# Patient Record
Sex: Male | Born: 2000 | Race: White | Hispanic: No | Marital: Single | Smoking: Never smoker
Health system: Southern US, Community
[De-identification: ages and names within clinical notes are randomized; demographics above are authoritative.]

## PROBLEM LIST (undated history)

## (undated) DIAGNOSIS — I499 Cardiac arrhythmia, unspecified: Secondary | ICD-10-CM

## (undated) HISTORY — PX: CARDIAC CATHETERIZATION: SHX172

---
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
## 2021-10-17 ENCOUNTER — Encounter: Payer: Self-pay | Admitting: Emergency Medicine

## 2021-10-17 ENCOUNTER — Other Ambulatory Visit: Payer: Self-pay

## 2021-10-17 DIAGNOSIS — F332 Major depressive disorder, recurrent severe without psychotic features: Secondary | ICD-10-CM | POA: Insufficient documentation

## 2021-10-17 DIAGNOSIS — Z20822 Contact with and (suspected) exposure to covid-19: Secondary | ICD-10-CM | POA: Insufficient documentation

## 2021-10-17 DIAGNOSIS — R45851 Suicidal ideations: Secondary | ICD-10-CM | POA: Insufficient documentation

## 2021-10-17 DIAGNOSIS — Y900 Blood alcohol level of less than 20 mg/100 ml: Secondary | ICD-10-CM | POA: Insufficient documentation

## 2021-10-17 LAB — ETHANOL: Alcohol, Ethyl (B): <10 mg/dL (ref <10)

## 2021-10-17 LAB — COMPREHENSIVE METABOLIC PANEL
ALT: 13 U/L (ref 0–44)
AST: 17 U/L (ref 15–41)
Albumin: 4.6 g/dL (ref 3.5–5.0)
Alkaline Phosphatase: 63 U/L (ref 38–126)
Anion gap: 5 (ref 5–15)
BUN: 17 mg/dL (ref 6–20)
CO2: 26 mmol/L (ref 22–32)
Calcium: 9.2 mg/dL (ref 8.9–10.3)
Chloride: 106 mmol/L (ref 98–111)
Creatinine, Ser: 0.88 mg/dL (ref 0.61–1.24)
GFR, Estimated: >60 mL/min (ref >60)
Glucose, Bld: 119 mg/dL — ABNORMAL HIGH (ref 70–99)
Potassium: 3.5 mmol/L (ref 3.5–5.1)
Sodium: 137 mmol/L (ref 135–145)
Total Bilirubin: 0.8 mg/dL (ref 0.3–1.2)
Total Protein: 7.6 g/dL (ref 6.5–8.1)

## 2021-10-17 LAB — CBC
HCT: 45.9 % (ref 39.0–52.0)
Hemoglobin: 15.9 g/dL (ref 13.0–17.0)
MCH: 30.2 pg (ref 26.0–34.0)
MCHC: 34.6 g/dL (ref 30.0–36.0)
MCV: 87.1 fL (ref 80.0–100.0)
Platelets: 177 10*3/uL (ref 150–400)
RBC: 5.27 MIL/uL (ref 4.22–5.81)
RDW: 12.3 % (ref 11.5–15.5)
WBC: 9 10*3/uL (ref 4.0–10.5)
nRBC: 0 % (ref 0.0–0.2)

## 2021-10-17 LAB — ACETAMINOPHEN LEVEL: Acetaminophen (Tylenol), Serum: <10 ug/mL — ABNORMAL LOW (ref 10–30)

## 2021-10-17 LAB — SALICYLATE LEVEL: Salicylate Lvl: <7 mg/dL — ABNORMAL LOW (ref 7.0–30.0)

## 2021-10-17 NOTE — ED Triage Notes (Signed)
Pt to ED from home brought in by police voluntarily for SI.  States just feeling down, like no one cares.  States sister called police.  Would cut wrists with knife.  Has cut his back before in the past.  Denies psych diagnosis or medications.  Denies drugs or alcohol, denies HI or A/V hallucinations.  Pt calm and cooperative in triage, chest rise even and unlabored.  Pt dressed into hospital appropriate scrubs by this RN and Georgiann Hahn, EDT.  Belongings placed into 1 bag.  Contents include: 2 braided bracelets, 1 silicone bracelet, 2 cell phones, wallet (no cash), 1 black t shirt, 1 pair green shoes, 1 pair black socks, 1 pair green long pants, 1 black belt, 1 pair blue boxers.

## 2021-10-18 ENCOUNTER — Inpatient Hospital Stay
Admission: RE | Admit: 2021-10-18 | Discharge: 2021-10-20 | DRG: 885 | Disposition: A | Discharge status: Home / Self Care | Payer: Medicaid Other | Source: Intra-hospital | Attending: Psychiatry | Admitting: Psychiatry

## 2021-10-18 ENCOUNTER — Other Ambulatory Visit: Payer: Self-pay

## 2021-10-18 ENCOUNTER — Encounter: Payer: Self-pay | Admitting: Psychiatry

## 2021-10-18 ENCOUNTER — Emergency Department (EMERGENCY_DEPARTMENT_HOSPITAL)
Admission: EM | Admit: 2021-10-18 | Discharge: 2021-10-18 | Disposition: A | Discharge status: Other Acute Inpatient Hospital | Payer: Medicaid Other | Source: Home / Self Care | Attending: Emergency Medicine | Admitting: Emergency Medicine

## 2021-10-18 DIAGNOSIS — F332 Major depressive disorder, recurrent severe without psychotic features: Secondary | ICD-10-CM | POA: Diagnosis present

## 2021-10-18 DIAGNOSIS — F4310 Post-traumatic stress disorder, unspecified: Secondary | ICD-10-CM | POA: Diagnosis present

## 2021-10-18 DIAGNOSIS — I350 Nonrheumatic aortic (valve) stenosis: Secondary | ICD-10-CM | POA: Diagnosis present

## 2021-10-18 DIAGNOSIS — R45851 Suicidal ideations: Secondary | ICD-10-CM | POA: Diagnosis present

## 2021-10-18 DIAGNOSIS — Z818 Family history of other mental and behavioral disorders: Secondary | ICD-10-CM | POA: Diagnosis not present

## 2021-10-18 DIAGNOSIS — R9431 Abnormal electrocardiogram [ECG] [EKG]: Secondary | ICD-10-CM | POA: Diagnosis not present

## 2021-10-18 DIAGNOSIS — Z9151 Personal history of suicidal behavior: Secondary | ICD-10-CM | POA: Diagnosis not present

## 2021-10-18 DIAGNOSIS — Z20822 Contact with and (suspected) exposure to covid-19: Secondary | ICD-10-CM | POA: Diagnosis present

## 2021-10-18 DIAGNOSIS — G4700 Insomnia, unspecified: Secondary | ICD-10-CM | POA: Diagnosis present

## 2021-10-18 DIAGNOSIS — Z91410 Personal history of adult physical and sexual abuse: Secondary | ICD-10-CM

## 2021-10-18 DIAGNOSIS — Z62811 Personal history of psychological abuse in childhood: Secondary | ICD-10-CM | POA: Diagnosis present

## 2021-10-18 DIAGNOSIS — F32A Depression, unspecified: Secondary | ICD-10-CM

## 2021-10-18 DIAGNOSIS — Z5181 Encounter for therapeutic drug level monitoring: Secondary | ICD-10-CM | POA: Diagnosis not present

## 2021-10-18 LAB — RESP PANEL BY RT-PCR (FLU A&B, COVID) ARPGX2
Influenza A by PCR: NEGATIVE
Influenza B by PCR: NEGATIVE
SARS Coronavirus 2 by RT PCR: NEGATIVE

## 2021-10-18 LAB — URINE DRUG SCREEN, QUALITATIVE (ARMC ONLY)
Amphetamines, Ur Screen: NOT DETECTED
Barbiturates, Ur Screen: NOT DETECTED
Benzodiazepine, Ur Scrn: NOT DETECTED
Cannabinoid 50 Ng, Ur ~~LOC~~: POSITIVE — AB
Cocaine Metabolite,Ur ~~LOC~~: NOT DETECTED
MDMA (Ecstasy)Ur Screen: NOT DETECTED
Methadone Scn, Ur: NOT DETECTED
Opiate, Ur Screen: NOT DETECTED
Phencyclidine (PCP) Ur S: NOT DETECTED
Tricyclic, Ur Screen: NOT DETECTED

## 2021-10-18 MED ORDER — ESCITALOPRAM OXALATE 10 MG PO TABS
5.0000 mg | ORAL_TABLET | Freq: Every day | ORAL | Status: DC
  Administered 2021-10-18: 11:00:00 5 mg via ORAL
  Filled 2021-10-18: qty 1

## 2021-10-18 MED ORDER — ESCITALOPRAM OXALATE 10 MG PO TABS: 5.0000 mg | ORAL_TABLET | Freq: Every day | ORAL | Status: DC

## 2021-10-18 MED ORDER — MAGNESIUM HYDROXIDE 400 MG/5ML PO SUSP: 30.0000 mL | Freq: Every day | ORAL | Status: DC | PRN

## 2021-10-18 MED ORDER — ALUM & MAG HYDROXIDE-SIMETH 200-200-20 MG/5ML PO SUSP: 30.0000 mL | ORAL | Status: DC | PRN

## 2021-10-18 MED ORDER — TRAZODONE HCL 50 MG PO TABS
50.0000 mg | ORAL_TABLET | Freq: Every day | ORAL | Status: DC
  Administered 2021-10-18: 21:00:00 50 mg via ORAL
  Filled 2021-10-18 (×2): qty 1

## 2021-10-18 MED ORDER — ACETAMINOPHEN 325 MG PO TABS: 650.0000 mg | ORAL_TABLET | Freq: Four times a day (QID) | ORAL | Status: DC | PRN

## 2021-10-18 MED ORDER — ESCITALOPRAM OXALATE 10 MG PO TABS
10.0000 mg | ORAL_TABLET | Freq: Every day | ORAL | Status: DC
  Administered 2021-10-19 – 2021-10-20 (×2): 10 mg via ORAL
  Filled 2021-10-18 (×2): qty 1

## 2021-10-18 MED ORDER — TRAZODONE HCL 50 MG PO TABS: 50.0000 mg | ORAL_TABLET | Freq: Every day | ORAL | Status: DC

## 2021-10-18 NOTE — Plan of Care (Signed)
D: Patient is newly admitted to [facility]. Patient's skin assessment completed with Kaelyn RN, skin is intact, no contraband found. Patient reports passive SI without a plan denied HI/AVH. Patient reports denies ETOH / Smoking. Patient reports current stressors of having nothing or no one to support himself.    A: Patient oriented to unit/room/call light. Patient was offered support and encouragement. Patient was encourage to attend groups, participate in unit activities and continue with plan of care. Q x 15 minute observation checks were completed for safety.   R: Patient has no complaints at this time. Patient is receptive to treatment and safety maintained on unit.
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
## 2021-10-18 NOTE — Tx Team (Signed)
Initial Treatment Plan 10/18/2021 1:46 PM Travis Colon [id number]    PATIENT STRESSORS: Financial difficulties   Marital or family conflict     PATIENT STRENGTHS: Capable of independent living  General fund of knowledge    PATIENT IDENTIFIED PROBLEMS: Ineffective coping skills  Suicidal Ideations                   DISCHARGE CRITERIA:  Ability to meet basic life and health needs Adequate post-discharge living arrangements  PRELIMINARY DISCHARGE PLAN: Return to previous living arrangement Return to previous work or school arrangements  PATIENT/FAMILY INVOLVEMENT: This treatment plan has been presented to and reviewed with the patient, Travis Colon,.  The patient and family have been given the opportunity to ask questions and make suggestions.  Berkley Harvey, RN 10/18/2021, 1:46 PM

## 2021-10-18 NOTE — ED Notes (Signed)
Report received from [place], [place]. Patient moved to [facility] 5 and oriented to unit. Awaiting psych eval. No other questions or needs at this time.

## 2021-10-18 NOTE — ED Notes (Signed)
VOL/Psych consult ordered/Pending

## 2021-10-18 NOTE — ED Provider Notes (Signed)
[facility] Emergency Department Provider Note  ____________________________________________   Event Date/Time   First MD Initiated Contact with Patient 10/18/21 [phone]     (approximate)  I have reviewed the triage vital signs and the nursing notes.   HISTORY  Chief Complaint Suicidal    HPI Travis Colon is a 20 y.o. male who reports suffering from depression for a long time.  He presents voluntarily tonight for evaluation of worsening depression with suicidal ideation.  He said that he just feels like nothing matters and that no one cares.  He spoke with his sister who called the police.  He claims to have been thinking about cutting his wrists.  He does not see a therapist and does not take any medications.  He denies drug use.  He has no medical complaints or concerns.  He cannot quantify how bad his symptoms are but reportedly they were severe tonight.  Nothing in particular makes them better or worse.     History reviewed. No pertinent past medical history.  There are no problems to display for this patient.   History reviewed. No pertinent surgical history.  Prior to Admission medications   Not on File    Allergies Patient has no known allergies.  History reviewed. No pertinent family history.  Social History Social History   Tobacco Use   Smoking status: Never   Smokeless tobacco: Never  Substance Use Topics   Alcohol use: Never   Drug use: Never    Review of Systems Constitutional: No fever/chills Eyes: No visual changes. ENT: No sore throat. Cardiovascular: Denies chest pain. Respiratory: Denies shortness of breath. Gastrointestinal: No abdominal pain.  No nausea, no vomiting.  No diarrhea.  No constipation. Genitourinary: Negative for dysuria. Musculoskeletal: Negative for neck pain.  Negative for back pain. Integumentary: Negative for rash. Neurological: Negative for headaches, focal weakness or numbness. Psychiatric:  Depression with suicidal thoughts, considering cutting his wrists.  ____________________________________________   PHYSICAL EXAM:  VITAL SIGNS: ED Triage Vitals  Enc Vitals Group     BP 10/17/21 2253 (!) 161/86     Pulse Rate 10/17/21 2253 85     Resp 10/17/21 2253 16     Temp 10/17/21 2253 98.4 F (36.9 C)     Temp Source 10/17/21 2253 Oral     SpO2 10/17/21 2253 98 %     Weight 10/17/21 2254 90.7 kg (200 lb)     Height 10/17/21 2254 2.007 m (6\' 7" )     Head Circumference --      Peak Flow --      Pain Score 10/17/21 2254 0     Pain Loc --      Pain Edu? --      Excl. in GC? --     Constitutional: Alert and oriented.  Eyes: Conjunctivae are normal.  Head: Atraumatic. Nose: No congestion/rhinnorhea. Mouth/Throat: Patient is wearing a mask. Neck: No stridor.  No meningeal signs.   Cardiovascular: Normal rate, regular rhythm. Good peripheral circulation. Respiratory: Normal respiratory effort.  No retractions. Gastrointestinal: Soft and nontender. No distention.  Musculoskeletal: No lower extremity tenderness nor edema. No gross deformities of extremities. Neurologic:  Normal speech and language. No gross focal neurologic deficits are appreciated.  Skin:  Skin is warm, dry and intact. Psychiatric: Mood and affect are flat.  Admits to SI earlier, denies SI now, admits to depression.  Denies HI.  ____________________________________________   LABS (all labs ordered are listed, but only abnormal results are  displayed)  Labs Reviewed  COMPREHENSIVE METABOLIC PANEL - Abnormal; Notable for the following components:      Result Value   Glucose, Bld 119 (*)    All other components within normal limits  SALICYLATE LEVEL - Abnormal; Notable for the following components:   Salicylate Lvl <7.0 (*)    All other components within normal limits  ACETAMINOPHEN LEVEL - Abnormal; Notable for the following components:   Acetaminophen (Tylenol), Serum <10 (*)    All other components  within normal limits  RESP PANEL BY RT-PCR (FLU A&B, COVID) ARPGX2  ETHANOL  CBC  URINE DRUG SCREEN, QUALITATIVE ([facility] ONLY)   ____________________________________________   INITIAL IMPRESSION / MDM / ASSESSMENT AND PLAN / ED COURSE  As part of my medical decision making, I reviewed the following data within the electronic MEDICAL RECORD NUMBER Nursing notes reviewed and incorporated, Labs reviewed , Old chart reviewed, and Notes from prior ED visits   Differential diagnosis includes, but is not limited to, depression, adjustment disorder, mood disorder.  Vital signs stable.  Patient calm and cooperative and shows good insight into his illness.  He showed good judgment by coming to the emergency department voluntarily even though it was after his sister called law enforcement.  Respiratory viral panel, CBC, ethanol level, acetaminophen level, salicylate level, and CMP are all within normal limits.  The patient is here voluntarily at this time and wants help.  It is unclear to me at this point whether he meets involuntary commitment criteria.  I do not believe he represents an immediate danger to himself and I think he would benefit from evaluation by psychiatry, but unfortunately we do not have psychiatry on staff tonight.  At this point the patient is comfortable waiting for evaluation.  I will have to reassess if he decides to leave but I will hold off putting him under involuntary commitment at this time.  The patient has been placed in psychiatric observation due to the need to provide a safe environment for the patient while obtaining psychiatric consultation and evaluation, as well as ongoing medical and medication management to treat the patient's condition.  The patient has not been placed under full IVC at this time.          ____________________________________________  FINAL CLINICAL IMPRESSION(S) / ED DIAGNOSES  Final diagnoses:  Depression, unspecified depression type   Suicidal ideation     MEDICATIONS GIVEN DURING THIS VISIT:  Medications - No data to display   ED Discharge Orders     None        Note:  This document was prepared using Dragon voice recognition software and may include unintentional dictation errors.   Loleta Rose, MD 10/18/21 [phone]
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
## 2021-10-18 NOTE — Plan of Care (Signed)
New admit   Problem: Education: Goal: Knowledge of  General Education information/materials will improve Outcome: Not Progressing Goal: Emotional status will improve Outcome: Not Progressing Goal: Mental status will improve Outcome: Not Progressing Goal: Verbalization of understanding the information provided will improve Outcome: Not Progressing

## 2021-10-18 NOTE — ED Notes (Signed)
Patient reports having thoughts of wanting to harm himself tonight by cutting his wrist but did not.  Also reports that he had these thoughts a few weeks ago and did not act on them then.  Patient does not give triggering reason(s) for these thoughts.  Patient does not maintain eye contact.

## 2021-10-18 NOTE — BHH Suicide Risk Assessment (Signed)
[facility] Admission Suicide Risk Assessment   Nursing information obtained from:  Patient Demographic factors:  Male Current Mental Status:  Suicidal ideation indicated by patient Loss Factors:  Decrease in vocational status, Financial problems / change in socioeconomic status Historical Factors:  NA Risk Reduction Factors:  NA  Principal Problem: Major depressive disorder, recurrent severe without psychotic features ([facility]) Diagnosis:  Principal Problem:   Major depressive disorder, recurrent severe without psychotic features ([facility])  Subjective Data:   Patient is 20 year old Caucasian male with no known mental health history, who presented to [facility] ED voluntarily due to complaints of worsening depression with suicidal thoughts and plan to cut his wrists.   Continued Clinical Symptoms:  Alcohol Use Disorder Identification Test Final Score (AUDIT): 0 The "Alcohol Use Disorders Identification Test", Guidelines for Use in Primary Care, Second Edition.  World Science writer [facility]). Score between 0-7:  no or low risk or alcohol related problems. Score between 8-15:  moderate risk of alcohol related problems. Score between 16-19:  high risk of alcohol related problems. Score 20 or above:  warrants further diagnostic evaluation for alcohol dependence and treatment.   CLINICAL FACTORS:   Depression:   Anhedonia Hopelessness Impulsivity Insomnia Severe   COGNITIVE FEATURES THAT CONTRIBUTE TO RISK:  None    SUICIDE RISK:   Moderate:  Frequent suicidal ideation with limited intensity, and duration, some specificity in terms of plans, no associated intent, good self-control, limited dysphoria/symptomatology, some risk factors present, and identifiable protective factors, including available and accessible social support.  PLAN OF CARE:  -inpatient psychiatric admission will be continued. -patient will be integrated in the milieu.   -patient will be encouraged to attend groups.      -Medications: We will continue started in ER Lexapro and will increase the dose to 10 mg po daily for depression, anxiety, PTSD. Will continue Trazodone 50 mg PO QHS for insomnia.   -We will attempt to collect collateral information.   -Disposition will be determined after the patient is stabilized.    I certify that inpatient services furnished can reasonably be expected to improve the patient's condition.   Thalia Party, MD 10/18/2021, 3:08 PM

## 2021-10-19 NOTE — Progress Notes (Signed)
D: Pt alert and oriented. Pt rates depression 2/10, hopelessness 0/10, and anxiety 0/10. Pt goal: "I would like to focus on myself instead of worrying about someone else." Pt reports energy level as normal and concentration as being good. Pt reports sleep last night as being good. Pt did not receive medications for sleep. Pt denies experiencing any pain at this time. Pt denies experiencing any SI/HI, or AVH at this time.   Pt is observed as being very quiet and reserved. Pt did attend group today and can be observed in the milieu interacting with others on the unit.  A: Scheduled medications administered to pt, per MD orders. Support and encouragement provided. Frequent verbal contact made. Routine safety checks conducted q15 minutes.   R: No adverse drug reactions noted. Pt verbally contracts for safety at this time. Pt complaint with medications and treatment plan. Pt interacts well with others on the unit. Pt remains safe at this time. Will continue to monitor.

## 2021-10-19 NOTE — Group Note (Signed)
[facility] LCSW Group Therapy Note   Group Date: 10/19/2021 Start Time: 1300 End Time: 1400  Type of Therapy/Topic:  Group Therapy:  Feelings about Diagnosis  Participation Level:  Active   Description of Group:    This group will allow patients to explore their thoughts and feelings about diagnoses they have received. Patients will be guided to explore their level of understanding and acceptance of these diagnoses. Facilitator will encourage patients to process their thoughts and feelings about the reactions of others to their diagnosis, and will guide patients in identifying ways to discuss their diagnosis with significant others in their lives. This group will be process-oriented, with patients participating in exploration of their own experiences as well as giving and receiving support and challenge from other group members.   Therapeutic Goals: 1. Patient will demonstrate understanding of diagnosis as evidence by identifying two or more symptoms of the disorder:  2. Patient will be able to express two feelings regarding the diagnosis 3. Patient will demonstrate ability to communicate their needs through discussion and/or role plays  Summary of Patient Progress: Patient was present in group.  Patient shared that for him "diagnosis" means "what makes you special".  He was engaged in group and attentive.  Patient shared that he does not feel like that he has a support system.  He reports that people are often more kind to physical diagnosis then mental health ones.    Therapeutic Modalities:   Cognitive Behavioral Therapy Brief Therapy Feelings Identification    Harden Mo, LCSW

## 2021-10-19 NOTE — Progress Notes (Signed)
[facility] MD Progress Note  10/19/2021 12:49 PM Travis Colon  MRN:  [id number]  Subjective:  20 yo male admitted with suicidal ideations related to depression.  His depression is low as it "is nice to be around people."  He likes to play video games, no money for games.  Sleep was "good" last night with medication and appetite is "good".  No side effects from his medications.  Principal Problem: Major depressive disorder, recurrent severe without psychotic features (HCC) Diagnosis: Principal Problem:   Major depressive disorder, recurrent severe without psychotic features (HCC)  Total Time spent with patient: 1 hour  Past Psychiatric History: depression and anxiety  Past Medical History: History reviewed. No pertinent past medical history. History reviewed. No pertinent surgical history. Family History: History reviewed. No pertinent family history. Family Psychiatric  History: mother with substance use disorder Social History:  Social History   Substance and Sexual Activity  Alcohol Use Never     Social History   Substance and Sexual Activity  Drug Use Never    Social History   Socioeconomic History   Marital status: Single    Spouse name: Not on file   Number of children: Not on file   Years of education: Not on file   Highest education level: Not on file  Occupational History   Not on file  Tobacco Use   Smoking status: Never   Smokeless tobacco: Never  Substance and Sexual Activity   Alcohol use: Never   Drug use: Never   Sexual activity: Not on file  Other Topics Concern   Not on file  Social History Narrative   Not on file   Social Determinants of Health   Financial Resource Strain: Not on file  Food Insecurity: Not on file  Transportation Needs: Not on file  Physical Activity: Not on file  Stress: Not on file  Social Connections: Not on file   Additional Social History: lives in an apartment with a roommate and worked at [employer]    Sleep:  Good  Appetite:  Good  Current Medications: Current Facility-Administered Medications  Medication Dose Route Frequency Provider Last Rate Last Admin   acetaminophen (TYLENOL) tablet 650 mg  650 mg Oral Q6H PRN Charm Rings, NP       alum & mag hydroxide-simeth (MAALOX/MYLANTA) 200-200-20 MG/5ML suspension 30 mL  30 mL Oral Q4H PRN Charm Rings, NP       escitalopram (LEXAPRO) tablet 10 mg  10 mg Oral Daily Thalia Party, MD   10 mg at 10/19/21 4431   magnesium hydroxide (MILK OF MAGNESIA) suspension 30 mL  30 mL Oral Daily PRN Charm Rings, NP       traZODone (DESYREL) tablet 50 mg  50 mg Oral QHS Charm Rings, NP   50 mg at 10/18/21 2117    Lab Results:  Results for orders placed or performed during the hospital encounter of 10/18/21 (from the past 48 hour(s))  Comprehensive metabolic panel     Status: Abnormal   Collection Time: 10/17/21 11:01 PM  Result Value Ref Range   Sodium 137 135 - 145 mmol/L   Potassium 3.5 3.5 - 5.1 mmol/L   Chloride 106 98 - 111 mmol/L   CO2 26 22 - 32 mmol/L   Glucose, Bld 119 (H) 70 - 99 mg/dL    Comment: Glucose reference range applies only to samples taken after fasting for at least 8 hours.   BUN 17 6 - 20 mg/dL  Creatinine, Ser 0.88 0.61 - 1.24 mg/dL   Calcium 9.2 8.9 - 35.3 mg/dL   Total Protein 7.6 6.5 - 8.1 g/dL   Albumin 4.6 3.5 - 5.0 g/dL   AST 17 15 - 41 U/L   ALT 13 0 - 44 U/L   Alkaline Phosphatase 63 38 - 126 U/L   Total Bilirubin 0.8 0.3 - 1.2 mg/dL   GFR, Estimated >61 >44 mL/min    Comment: (NOTE) Calculated using the CKD-EPI Creatinine Equation (2021)    Anion gap 5 5 - 15    Comment: Performed at [facility], [address] [place], [place] [address]  Ethanol     Status: None   Collection Time: 10/17/21 11:01 PM  Result Value Ref Range   Alcohol, Ethyl (B) <10 <10 mg/dL    Comment: (NOTE) Lowest detectable limit for serum alcohol is 10 mg/dL.  For medical purposes only. Performed at [facility], [address] [place], [place] [address]   Salicylate level     Status: Abnormal   Collection Time: 10/17/21 11:01 PM  Result Value Ref Range   Salicylate Lvl <7.0 (L) 7.0 - 30.0 mg/dL    Comment: Performed at [facility], [address] [place], [place] [address]  Acetaminophen level     Status: Abnormal   Collection Time: 10/17/21 11:01 PM  Result Value Ref Range   Acetaminophen (Tylenol), Serum <10 (L) 10 - 30 ug/mL    Comment: (NOTE) Therapeutic concentrations vary significantly. A range of 10-30 ug/mL  may be an effective concentration for many patients. However, some  are best treated at concentrations outside of this range. Acetaminophen concentrations >150 ug/mL at 4 hours after ingestion  and >50 ug/mL at 12 hours after ingestion are often associated with  toxic reactions.  Performed at [facility], [address] [place], [place] [address]   cbc     Status: None   Collection Time: 10/17/21 11:01 PM  Result Value Ref Range   WBC 9.0 4.0 - 10.5 K/uL   RBC 5.27 4.22 - 5.81 MIL/uL   Hemoglobin 15.9 13.0 - 17.0 g/dL   HCT 24.5 80.9 - 98.3 %   MCV 87.1 80.0 - 100.0 fL   MCH 30.2 26.0 - 34.0 pg   MCHC 34.6 30.0 - 36.0 g/dL   RDW 38.2 50.5 - 39.7 %   Platelets 177 150 - 400 K/uL   nRBC 0.0 0.0 - 0.2 %    Comment: Performed at [facility], [address], [place], [place] [address]  Resp Panel by RT-PCR (Flu A&B, Covid) Nasopharyngeal Swab     Status: None   Collection Time: 10/17/21 11:02 PM   Specimen: Nasopharyngeal Swab; Nasopharyngeal(NP) swabs in vial transport medium  Result Value Ref Range   SARS Coronavirus 2 by RT PCR NEGATIVE NEGATIVE    Comment: (NOTE) SARS-CoV-2 target nucleic acids are NOT DETECTED.  The SARS-CoV-2 RNA is generally detectable in upper respiratory specimens during the acute phase of infection. The lowest concentration of SARS-CoV-2 viral copies this assay can detect is 138 copies/mL. A  negative result does not preclude SARS-Cov-2 infection and should not be used as the sole basis for treatment or other patient management decisions. A negative result may occur with  improper specimen collection/handling, submission of specimen other than nasopharyngeal swab, presence of viral mutation(s) within the areas targeted by this assay, and inadequate number of viral copies(<138 copies/mL). A negative result must be combined with clinical  observations, patient history, and epidemiological information. The expected result is Negative.  Fact Sheet for Patients:  BloggerCourse.com  Fact Sheet for Healthcare Providers:  SeriousBroker.it  This test is no t yet approved or cleared by the [place] FDA and  has been authorized for detection and/or diagnosis of SARS-CoV-2 by FDA under an Emergency Use Authorization (EUA). This EUA will remain  in effect (meaning this test can be used) for the duration of the COVID-19 declaration under Section 564(b)(1) of the Act, 21 U.S.C.section 360bbb-3(b)(1), unless the authorization is terminated  or revoked sooner.       Influenza A by PCR NEGATIVE NEGATIVE   Influenza B by PCR NEGATIVE NEGATIVE    Comment: (NOTE) The Xpert Xpress SARS-CoV-2/FLU/RSV plus assay is intended as an aid in the diagnosis of influenza from Nasopharyngeal swab specimens and should not be used as a sole basis for treatment. Nasal washings and aspirates are unacceptable for Xpert Xpress SARS-CoV-2/FLU/RSV testing.  Fact Sheet for Patients: BloggerCourse.com  Fact Sheet for Healthcare Providers: SeriousBroker.it  This test is not yet approved or cleared by the [place] FDA and has been authorized for detection and/or diagnosis of SARS-CoV-2 by FDA under an Emergency Use Authorization (EUA). This EUA will remain in effect (meaning this test can be used)  for the duration of the COVID-19 declaration under Section 564(b)(1) of the Act, 21 U.S.C. section 360bbb-3(b)(1), unless the authorization is terminated or revoked.  Performed at [facility], [address] [place], [place] [address]   Urine Drug Screen, Qualitative     Status: Abnormal   Collection Time: 10/18/21 11:53 AM  Result Value Ref Range   Tricyclic, Ur Screen NONE DETECTED NONE DETECTED   Amphetamines, Ur Screen NONE DETECTED NONE DETECTED   MDMA (Ecstasy)Ur Screen NONE DETECTED NONE DETECTED   Cocaine Metabolite,Ur [place] NONE DETECTED NONE DETECTED   Opiate, Ur Screen NONE DETECTED NONE DETECTED   Phencyclidine (PCP) Ur S NONE DETECTED NONE DETECTED   Cannabinoid 50 Ng, Ur [place] POSITIVE (A) NONE DETECTED   Barbiturates, Ur Screen NONE DETECTED NONE DETECTED   Benzodiazepine, Ur Scrn NONE DETECTED NONE DETECTED   Methadone Scn, Ur NONE DETECTED NONE DETECTED    Comment: (NOTE) Tricyclics + metabolites, urine    Cutoff 1000 ng/mL Amphetamines + metabolites, urine  Cutoff 1000 ng/mL MDMA (Ecstasy), urine              Cutoff 500 ng/mL Cocaine Metabolite, urine          Cutoff 300 ng/mL Opiate + metabolites, urine        Cutoff 300 ng/mL Phencyclidine (PCP), urine         Cutoff 25 ng/mL Cannabinoid, urine                 Cutoff 50 ng/mL Barbiturates + metabolites, urine  Cutoff 200 ng/mL Benzodiazepine, urine              Cutoff 200 ng/mL Methadone, urine                   Cutoff 300 ng/mL  The urine drug screen provides only a preliminary, unconfirmed analytical test result and should not be used for non-medical purposes. Clinical consideration and professional judgment should be applied to any positive drug screen result due to possible interfering substances. A more specific alternate chemical method must be used in order to obtain a confirmed analytical result. Gas chromatography / mass spectrometry (GC/MS) is the preferred confirm atory  method. Performed  at [facility], [address] [place], [place] [address]     Blood Alcohol level:  Lab Results  Component Value Date   [facility] <10 10/17/2021    Metabolic Disorder Labs: No results found for: HGBA1C, MPG No results found for: PROLACTIN No results found for: CHOL, TRIG, HDL, CHOLHDL, VLDL, LDLCALC  Musculoskeletal: Strength & Muscle Tone: within normal limits Gait & Station: normal Patient leans: N/A  Psychiatric Specialty Exam: Physical Exam Vitals and nursing note reviewed.  Constitutional:      Appearance: Normal appearance.  HENT:     Head: Normocephalic.     Nose: Nose normal.  Pulmonary:     Effort: Pulmonary effort is normal.  Musculoskeletal:        General: Normal range of motion.     Cervical back: Normal range of motion.  Neurological:     General: No focal deficit present.     Mental Status: He is alert and oriented to person, place, and time.  Psychiatric:        Attention and Perception: Attention and perception normal.        Mood and Affect: Mood is anxious and depressed.        Speech: Speech normal.        Behavior: Behavior normal. Behavior is cooperative.        Thought Content: Thought content normal.        Cognition and Memory: Cognition and memory normal.        Judgment: Judgment normal.    Review of Systems  Constitutional:  Positive for malaise/fatigue.  Psychiatric/Behavioral:  Positive for depression. The patient is nervous/anxious.   All other systems reviewed and are negative.  Blood pressure 133/73, pulse (!) 103, temperature 98.3 F (36.8 C), temperature source Oral, resp. rate 18, height  (2.007 m), weight 83 kg, SpO2 98 %.Body mass index is 20.62 kg/m.  General Appearance: Casual  Eye Contact:  Fair  Speech:  Normal Rate  Volume:  Normal  Mood:  Anxious and Depressed  Affect:  Congruent  Thought Process:  Coherent and Descriptions of Associations: Intact  Orientation:  Full (Time, Place, and Person)  Thought  Content:  Rumination  Suicidal Thoughts:  No  Homicidal Thoughts:  No  Memory:  Immediate;   Good Recent;   Good Remote;   Good  Judgement:  Fair  Insight:  Fair  Psychomotor Activity:  Normal  Concentration:  Concentration: Good and Attention Span: Good  Recall:  Good  Fund of Knowledge:  Good  Language:  Good  Akathisia:  No  Handed:  Right  AIMS (if indicated):     Assets:  Housing Leisure Time Physical Health Resilience  ADL's:  Intact  Cognition:  WNL  Sleep:  Number of Hours: 8.15      Physical Exam: Physical Exam Vitals and nursing note reviewed.  Constitutional:      Appearance: Normal appearance.  HENT:     Head: Normocephalic.     Nose: Nose normal.  Pulmonary:     Effort: Pulmonary effort is normal.  Musculoskeletal:        General: Normal range of motion.     Cervical back: Normal range of motion.  Neurological:     General: No focal deficit present.     Mental Status: He is alert and oriented to person, place, and time.  Psychiatric:        Attention and Perception: Attention and perception normal.  Mood and Affect: Mood is anxious and depressed.        Speech: Speech normal.        Behavior: Behavior normal. Behavior is cooperative.        Thought Content: Thought content normal.        Cognition and Memory: Cognition and memory normal.        Judgment: Judgment normal.   Review of Systems  Constitutional:  Positive for malaise/fatigue.  Psychiatric/Behavioral:  Positive for depression. The patient is nervous/anxious.   All other systems reviewed and are negative. Blood pressure 133/73, pulse (!) 103, temperature 98.3 F (36.8 C), temperature source Oral, resp. rate 18, height  (2.007 m), weight 83 kg, SpO2 98 %. Body mass index is 20.62 kg/m.   Treatment Plan Summary: Daily contact with patient to assess and evaluate symptoms and progress in treatment, Medication management, and Plan : Major depressive disorder, recurrent,  moderate: Continued Lexapro 10 mg daily  Insomnia: Continue Trazodone 50 mg daily at bedtime PRN  Nanine Means, NP 10/19/2021, 12:49 PM

## 2021-10-19 NOTE — [facility] Suicide Risk Assessment (Signed)
[facility] INPATIENT:  Family/Significant Other Suicide Prevention Education  Suicide Prevention Education:  Patient Refusal for Family/Significant Other Suicide Prevention Education: The patient Travis Colon has refused to provide written consent for family/significant other to be provided Family/Significant Other Suicide Prevention Education during admission and/or prior to discharge.  Physician notified. SPE completed with pt, as pt refused to consent to family contact. SPI pamphlet provided to pt and pt was encouraged to share information with support network, ask questions, and talk about any concerns relating to SPE. Pt denies access to guns/firearms and verbalized understanding of information provided. Mobile Crisis information also provided to pt.    Harden Mo 10/19/2021, 11:43 AM

## 2021-10-19 NOTE — Progress Notes (Signed)
Recreation Therapy Notes   Date: 10/19/2021  Time: 10:20 am   Location: Craft room    Behavioral response: Appropriate  Intervention Topic: Goals   Discussion/Intervention:  Group content on today was focused on goals. Patients described what goals are and how they define goals. Individuals expressed how they go about setting goals and reaching them. The group identified how important goals are and if they make short term goals to reach long term goals. Patients described how many goals they work on at a time and what affects them not reaching their goal. Individuals described how much time they put into planning and obtaining their goals. The group participated in the intervention My Goal Board and made personal goal boards to help them achieve their goal. Clinical Observations/Feedback: Patient came to group late due to unknown reasons. Individual engaged in the intervention and was social with staff and peers.  Travis Colon LRT/CTRS           [name] 10/19/2021 12:26 PM
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
## 2021-10-19 NOTE — Plan of Care (Signed)
°  Problem: Education: Goal: Knowledge of [place] [facility] Education information/materials will improve Outcome: Progressing Goal: Emotional status will improve Outcome: Progressing Goal: Mental status will improve Outcome: Progressing Goal: Verbalization of understanding the information provided will improve Outcome: Progressing   Problem: Health Behavior/Discharge Planning: Goal: Identification of resources available to assist in meeting health care needs will improve Outcome: Progressing Goal: Compliance with treatment plan for underlying cause of condition will improve Outcome: Progressing   Problem: Safety: Goal: Periods of time without injury will increase Outcome: Progressing

## 2021-10-20 DIAGNOSIS — F332 Major depressive disorder, recurrent severe without psychotic features: Principal | ICD-10-CM

## 2021-10-20 MED ORDER — TRAZODONE HCL 50 MG PO TABS
50.0000 mg | ORAL_TABLET | Freq: Every evening | ORAL | 2 refills | Status: AC | PRN
Start: 2021-10-20 — End: ?

## 2021-10-20 MED ORDER — ESCITALOPRAM OXALATE 10 MG PO TABS: 10.0000 mg | ORAL_TABLET | Freq: Every day | ORAL | 2 refills | Status: AC
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
## 2021-10-20 NOTE — Progress Notes (Signed)
D: Pt alert and oriented. Pt denies experiencing any pain, SI/HI, or AVH at this time. Pt reports he will be able to keep himself safe when he returns home.   A: Pt received discharge and medication education/information. Pt belongings were returned and signed for at this time.   R: Pt verbalized understanding of discharge and medication education/information.  Pt escorted by staff to medical mall front lobby where pt was picked up by his sister.

## 2021-10-20 NOTE — Progress Notes (Signed)
°  [facility] Adult Case Management Discharge Plan :  Will you be returning to the same living situation after discharge:  Yes,  pt plans to return home. At discharge, do you have transportation home?: Yes,  CSW to arrange transportation. Do you have the ability to pay for your medications: Yes,  [facility] [employer] [employer].  Release of information consent forms completed and in the chart;  Patient's signature needed at discharge.  Patient to Follow up at:  Follow-up Information     [facility] Follow up.   Why: Your appointment is scheduled for Wednesday, 10/27/21 at 9:30AM. Thanks! Contact information: [address] [place] [address] [phone]                 Next level of care provider has access to [facility] Link:no  Safety Planning and Suicide Prevention discussed: Yes,  SPE completed with pt.     Has patient been referred to the Quitline?: N/A patient is not a smoker  Patient has been referred for addiction treatment: N/A  Glenis Smoker, LCSW 10/20/2021, 9:43 AM

## 2021-10-20 NOTE — BHH Suicide Risk Assessment (Cosign Needed)
[facility] Discharge Suicide Risk Assessment   Principal Problem: Major depressive disorder, recurrent severe without psychotic features [facility]) Discharge Diagnoses: Principal Problem:   Major depressive disorder, recurrent severe without psychotic features (HCC)   Total Time spent with patient: 1 hour  Musculoskeletal: Strength & Muscle Tone: within normal limits Gait & Station: normal Patient leans: N/A Psychiatric Specialty Exam:  Psychiatric Specialty Exam: Physical Exam Vitals and nursing note reviewed.  Constitutional:      Appearance: Normal appearance.  HENT:     Head: Normocephalic.     Nose: Nose normal.  Pulmonary:     Effort: Pulmonary effort is normal.  Musculoskeletal:        General: Normal range of motion.     Cervical back: Normal range of motion.  Neurological:     General: No focal deficit present.     Mental Status: He is alert and oriented to person, place, and time.  Psychiatric:        Attention and Perception: Attention and perception normal.        Mood and Affect: Mood is anxious and depressed.        Speech: Speech normal.        Behavior: Behavior normal. Behavior is cooperative.        Thought Content: Thought content normal.        Cognition and Memory: Cognition and memory normal.        Judgment: Judgment normal.    Review of Systems  Psychiatric/Behavioral:  Positive for depression. The patient is nervous/anxious.   All other systems reviewed and are negative.  Blood pressure 134/68, pulse 84, temperature 98.4 F (36.9 C), temperature source Oral, resp. rate 18, height 6\' 7"  (2.007 m), weight 83 kg, SpO2 100 %.Body mass index is 20.62 kg/m.  General Appearance: Casual  Eye Contact:  Good  Speech:  Normal Rate  Volume:  Normal  Mood:  Anxious and Depressed  Affect:  Congruent  Thought Process:  Coherent and Descriptions of Associations: Intact  Orientation:  Full (Time, Place, and Person)  Thought Content:  WDL and Logical  Suicidal  Thoughts:  No  Homicidal Thoughts:  No  Memory:  Immediate;   Good Recent;   Good Remote;   Good  Judgement:  Good  Insight:  Good  Psychomotor Activity:  Normal  Concentration:  Concentration: Good and Attention Span: Good  Recall:  Good  Fund of Knowledge:  Good  Language:  Good  Akathisia:  No  Handed:  Right  AIMS (if indicated):     Assets:  Housing Leisure Time Physical Health Resilience Social Support  ADL's:  Intact  Cognition:  WNL  Sleep:  Number of Hours: 8.15   Physical Exam: Physical Exam Vitals and nursing note reviewed.  Constitutional:      Appearance: Normal appearance.  HENT:     Head: Normocephalic.     Nose: Nose normal.  Pulmonary:     Effort: Pulmonary effort is normal.  Musculoskeletal:        General: Normal range of motion.     Cervical back: Normal range of motion.  Neurological:     General: No focal deficit present.     Mental Status: He is alert and oriented to person, place, and time.  Psychiatric:        Attention and Perception: Attention and perception normal.        Mood and Affect: Mood is depressed. Mood is not anxious.  Speech: Speech normal.        Behavior: Behavior normal. Behavior is cooperative.        Thought Content: Thought content normal.        Cognition and Memory: Cognition and memory normal.        Judgment: Judgment normal.   Review of Systems  Psychiatric/Behavioral:  Positive for depression. The patient is nervous/anxious.   All other systems reviewed and are negative. Blood pressure 134/68, pulse 84, temperature 98.4 F (36.9 C), temperature source Oral, resp. rate 18, height 6\' 7"  (2.007 m), weight 83 kg, SpO2 100 %. Body mass index is 20.62 kg/m.  Mental Status Per Nursing Assessment::   On Admission:  Suicidal ideation indicated by patient  Demographic Factors:  Male and Caucasian  Loss Factors: NA  Historical Factors: NA  Risk Reduction Factors:   Sense of responsibility to family,  Employed, Positive social support, and Positive coping skills or problem solving skills  Continued Clinical Symptoms:  Depression and anxiety  Cognitive Features That Contribute To Risk:  None    Suicide Risk:  Minimal: No identifiable suicidal ideation.  Patients presenting with no risk factors but with morbid ruminations; may be classified as minimal risk based on the severity of the depressive symptoms   Follow-up Information     [facility] Follow up.   Why: Your appointment is scheduled for Wednesday, 10/27/21 at 9:30AM. Thanks! Contact information: [address] [place] [phone]                 Plan Of Care/Follow-up recommendations:  Activity:  as tolerted Diet:  heart healthy diet  Major depressive disorder, recurrent, moderate: Continued Lexapro 10 mg daily   Insomnia: Continue Trazodone 50 mg daily at bedtime PRN   Comments:  follow up with [facility] [phone], NP 10/20/2021, 9:53 AM

## 2021-10-20 NOTE — Progress Notes (Signed)
Patient calm and pleasant during assessment denying SI/HI/AVH. Patient endorses depression. Patient stated he didn't think he needed his sleep medication tonight. Patient given education, support, and encouragement to be active in his treatment plan. Pt observed by this Clinical research associate interacting appropriately with staff and peers on the unit. Pt being monitored Q 15 minutes for safety per unit protocol. Pt remains safe on the unit.

## 2021-10-20 NOTE — Progress Notes (Signed)
Recreation Therapy Notes   Date: 10/20/2021  Time: 10:00 am    Location:  Craft room    Behavioral response: N/A   Intervention Topic: Stress Management    Discussion/Intervention: Patient did not attend group.  Clinical Observations/Feedback:  Patient did not attend group.   Fayola Meckes LRT/CTRS        Rubby Barbary 10/20/2021 12:17 PM

## 2021-10-20 NOTE — BH IP Treatment Plan (Addendum)
Interdisciplinary Treatment and Diagnostic Plan Update  10/20/2021 Time of Session: 0900 Giles Currie MRN: [id number]  Principal Diagnosis: Major depressive disorder, recurrent severe without psychotic features (HCC)  Secondary Diagnoses: Principal Problem:   Major depressive disorder, recurrent severe without psychotic features (HCC)   Current Medications:  Current Facility-Administered Medications  Medication Dose Route Frequency Provider Last Rate Last Admin   acetaminophen (TYLENOL) tablet 650 mg  650 mg Oral Q6H PRN Charm Rings, NP       alum & mag hydroxide-simeth (MAALOX/MYLANTA) 200-200-20 MG/5ML suspension 30 mL  30 mL Oral Q4H PRN Charm Rings, NP       escitalopram (LEXAPRO) tablet 10 mg  10 mg Oral Daily Paliy, Serina Cowper, MD   10 mg at 10/20/21 0820   magnesium hydroxide (MILK OF MAGNESIA) suspension 30 mL  30 mL Oral Daily PRN Charm Rings, NP       traZODone (DESYREL) tablet 50 mg  50 mg Oral QHS Charm Rings, NP   50 mg at 10/18/21 2117   PTA Medications: No medications prior to admission.    Patient Stressors: Financial difficulties   Marital or family conflict    Patient Strengths: Capable of independent living  General fund of knowledge   Treatment Modalities: Medication Management, Group therapy, Case management,  1 to 1 session with clinician, Psychoeducation, Recreational therapy.   Physician Treatment Plan for Primary Diagnosis: Major depressive disorder, recurrent severe without psychotic features (HCC) Long Term Goal(s): Improvement in symptoms so as ready for discharge   Short Term Goals: Ability to identify changes in lifestyle to reduce recurrence of condition will improve Ability to verbalize feelings will improve Ability to disclose and discuss suicidal ideas Ability to demonstrate self-control will improve Ability to identify and develop effective coping behaviors will improve Ability to maintain clinical measurements within normal  limits will improve Compliance with prescribed medications will improve Ability to identify triggers associated with substance abuse/mental health issues will improve  Medication Management: Evaluate patient's response, side effects, and tolerance of medication regimen.  Therapeutic Interventions: 1 to 1 sessions, Unit Group sessions and Medication administration.  Evaluation of Outcomes: Adequate for Discharge  Physician Treatment Plan for Secondary Diagnosis: Principal Problem:   Major depressive disorder, recurrent severe without psychotic features (HCC)  Long Term Goal(s): Improvement in symptoms so as ready for discharge   Short Term Goals: Ability to identify changes in lifestyle to reduce recurrence of condition will improve Ability to verbalize feelings will improve Ability to disclose and discuss suicidal ideas Ability to demonstrate self-control will improve Ability to identify and develop effective coping behaviors will improve Ability to maintain clinical measurements within normal limits will improve Compliance with prescribed medications will improve Ability to identify triggers associated with substance abuse/mental health issues will improve     Medication Management: Evaluate patient's response, side effects, and tolerance of medication regimen.  Therapeutic Interventions: 1 to 1 sessions, Unit Group sessions and Medication administration.  Evaluation of Outcomes: Adequate for Discharge   RN Treatment Plan for Primary Diagnosis: Major depressive disorder, recurrent severe without psychotic features (HCC) Long Term Goal(s): Knowledge of disease and therapeutic regimen to maintain health will improve  Short Term Goals: Ability to remain free from injury will improve, Ability to verbalize frustration and anger appropriately will improve, Ability to demonstrate self-control, Ability to participate in decision making will improve, Ability to verbalize feelings will  improve, Ability to disclose and discuss suicidal ideas, Ability to identify and develop effective coping behaviors  will improve, and Compliance with prescribed medications will improve  Medication Management: RN will administer medications as ordered by provider, will assess and evaluate patient's response and provide education to patient for prescribed medication. RN will report any adverse and/or side effects to prescribing provider.  Therapeutic Interventions: 1 on 1 counseling sessions, Psychoeducation, Medication administration, Evaluate responses to treatment, Monitor vital signs and CBGs as ordered, Perform/monitor CIWA, COWS, AIMS and Fall Risk screenings as ordered, Perform wound care treatments as ordered.  Evaluation of Outcomes: Adequate for Discharge   LCSW Treatment Plan for Primary Diagnosis: Major depressive disorder, recurrent severe without psychotic features (HCC) Long Term Goal(s): Safe transition to appropriate next level of care at discharge, Engage patient in therapeutic group addressing interpersonal concerns.  Short Term Goals: Engage patient in aftercare planning with referrals and resources, Increase social support, Increase ability to appropriately verbalize feelings, Increase emotional regulation, Facilitate acceptance of mental health diagnosis and concerns, Facilitate patient progression through stages of change regarding substance use diagnoses and concerns, Identify triggers associated with mental health/substance abuse issues, and Increase skills for wellness and recovery  Therapeutic Interventions: Assess for all discharge needs, 1 to 1 time with Social worker, Explore available resources and support systems, Assess for adequacy in community support network, Educate family and significant other(s) on suicide prevention, Complete Psychosocial Assessment, Interpersonal group therapy.  Evaluation of Outcomes: Adequate for Discharge   Progress in  Treatment: Attending groups: Yes. Participating in groups: Yes. Taking medication as prescribed: Yes. Toleration medication: Yes. Family/Significant other contact made: No, will contact:  Patient declined consent for CSW to reach collateral.  Patient understands diagnosis: Yes. Discussing patient identified problems/goals with staff: Yes. Medical problems stabilized or resolved: Yes. Denies suicidal/homicidal ideation: Yes. Issues/concerns per patient self-inventory: Yes. Other: none   New problem(s) identified: No, Describe:  No additional problems identified at this time.   New Short Term/Long Term Goal(s): Patient will continue to work toward medication management for mood stabilization; elimination of SI thoughts; development of comprehensive mental wellness/sobriety plan in the outpatient setting.   Patient Goals: "to be healthy."   Discharge Plan or Barriers: No barriers anticipated at this time, patient to return to place of residence.   Reason for Continuation of Hospitalization: Depression Suicidal ideation  Estimated Length of Stay: Patient anticipated to discharge on this day.    Scribe for Treatment Team: Almedia Balls 10/20/2021 10:27 AM

## 2021-10-20 NOTE — Progress Notes (Signed)
D: Pt alert and oriented. Pt rates depression 1/10, hopelessness 0/10, and anxiety 0/10. Pt goal: "Trying to find enjoyment in what I do have." Pt reports energy level as normal and concentration as being good. Pt reports sleep last night as being good. Pt did not receive medications for sleep. Pt denies experiencing any pain at this time. Pt denies experiencing any SI/HI, or AVH at this time.   A: Scheduled medications administered to pt, per MD orders. Support and encouragement provided. Frequent verbal contact made. Routine safety checks conducted q15 minutes.   R: No adverse drug reactions noted. Pt verbally contracts for safety at this time. Pt complaint with medications and treatment plan. Pt interacts well with others on the unit. Pt remains safe at this time. Will continue to monitor.
